# Patient Record
Sex: Female | Born: 1960 | Race: White | Hispanic: No | Marital: Single | Smoking: Former smoker
Health system: Southern US, Community
[De-identification: ages and names within clinical notes are randomized; demographics above are authoritative.]

---

## 2013-10-27 ENCOUNTER — Emergency Department: Payer: Self-pay | Admitting: Emergency Medicine

## 2013-10-27 LAB — URINALYSIS, COMPLETE
Bilirubin,UR: NEGATIVE
Glucose,UR: NEGATIVE mg/dL (ref 0–75)
KETONE: NEGATIVE
LEUKOCYTE ESTERASE: NEGATIVE
NITRITE: NEGATIVE
PH: 6 (ref 4.5–8.0)
Protein: NEGATIVE
SPECIFIC GRAVITY: 1.01 (ref 1.003–1.030)
WBC UR: <1 /HPF (ref 0–5)

## 2013-10-27 LAB — DRUG SCREEN, URINE

## 2013-10-27 LAB — COMPREHENSIVE METABOLIC PANEL
Albumin: 4.3 g/dL (ref 3.4–5.0)
Alkaline Phosphatase: 63 U/L
Anion Gap: 5 — ABNORMAL LOW (ref 7–16)
BILIRUBIN TOTAL: 0.3 mg/dL (ref 0.2–1.0)
BUN: 9 mg/dL (ref 7–18)
CO2: 28 mmol/L (ref 21–32)
Calcium, Total: 9.1 mg/dL (ref 8.5–10.1)
Chloride: 104 mmol/L (ref 98–107)
Creatinine: 0.99 mg/dL (ref 0.60–1.30)
EGFR (African American): >60
GLUCOSE: 98 mg/dL (ref 65–99)
OSMOLALITY: 272 (ref 275–301)
POTASSIUM: 3.4 mmol/L — AB (ref 3.5–5.1)
SGOT(AST): 14 U/L — ABNORMAL LOW (ref 15–37)
SGPT (ALT): 22 U/L (ref 12–78)
SODIUM: 137 mmol/L (ref 136–145)
Total Protein: 7.7 g/dL (ref 6.4–8.2)

## 2013-10-27 LAB — CBC
HCT: 44.3 % (ref 35.0–47.0)
HGB: 14.7 g/dL (ref 12.0–16.0)
MCH: 31.5 pg (ref 26.0–34.0)
MCHC: 33.1 g/dL (ref 32.0–36.0)
MCV: 95 fL (ref 80–100)
Platelet: 243 10*3/uL (ref 150–440)
RBC: 4.66 10*6/uL (ref 3.80–5.20)
RDW: 13.1 % (ref 11.5–14.5)
WBC: 13.1 10*3/uL — ABNORMAL HIGH (ref 3.6–11.0)

## 2013-10-27 LAB — ETHANOL
Ethanol %: <0.003 % (ref 0.000–0.080)
Ethanol: <3 mg/dL

## 2013-10-27 LAB — SALICYLATE LEVEL: Salicylates, Serum: 4.6 mg/dL — ABNORMAL HIGH

## 2013-10-27 LAB — ACETAMINOPHEN LEVEL: Acetaminophen: <2 ug/mL

## 2013-11-03 LAB — COMPREHENSIVE METABOLIC PANEL
ALBUMIN: 3.8 g/dL (ref 3.4–5.0)
ANION GAP: 8 (ref 7–16)
Alkaline Phosphatase: 58 U/L
BUN: 4 mg/dL — AB (ref 7–18)
Bilirubin,Total: 0.3 mg/dL (ref 0.2–1.0)
Calcium, Total: 9 mg/dL (ref 8.5–10.1)
Chloride: 104 mmol/L (ref 98–107)
Co2: 27 mmol/L (ref 21–32)
Creatinine: 0.65 mg/dL (ref 0.60–1.30)
Glucose: 91 mg/dL (ref 65–99)
Osmolality: 274 (ref 275–301)
Potassium: 3.4 mmol/L — ABNORMAL LOW (ref 3.5–5.1)
SGOT(AST): 13 U/L — ABNORMAL LOW (ref 15–37)
SGPT (ALT): 22 U/L (ref 12–78)
SODIUM: 139 mmol/L (ref 136–145)
Total Protein: 7 g/dL (ref 6.4–8.2)

## 2013-11-03 LAB — URINALYSIS, COMPLETE
BACTERIA: NONE SEEN
Bilirubin,UR: NEGATIVE
GLUCOSE, UR: NEGATIVE mg/dL (ref 0–75)
Ketone: NEGATIVE
Leukocyte Esterase: NEGATIVE
NITRITE: NEGATIVE
PH: 6 (ref 4.5–8.0)
Protein: NEGATIVE
RBC,UR: 2 /HPF (ref 0–5)
SPECIFIC GRAVITY: 1.004 (ref 1.003–1.030)
Squamous Epithelial: NONE SEEN

## 2013-11-03 LAB — DRUG SCREEN, URINE

## 2013-11-03 LAB — PREGNANCY, URINE: Pregnancy Test, Urine: NEGATIVE m[IU]/mL

## 2013-11-03 LAB — CBC
HCT: 40.9 % (ref 35.0–47.0)
HGB: 13.9 g/dL (ref 12.0–16.0)
MCH: 32.1 pg (ref 26.0–34.0)
MCHC: 34 g/dL (ref 32.0–36.0)
MCV: 94 fL (ref 80–100)
Platelet: 235 10*3/uL (ref 150–440)
RBC: 4.33 10*6/uL (ref 3.80–5.20)
RDW: 13 % (ref 11.5–14.5)
WBC: 10.8 10*3/uL (ref 3.6–11.0)

## 2013-11-03 LAB — ETHANOL
Ethanol %: <0.003 % (ref 0.000–0.080)
Ethanol: <3 mg/dL

## 2013-11-03 LAB — ACETAMINOPHEN LEVEL: Acetaminophen: <2 ug/mL

## 2013-11-03 LAB — SALICYLATE LEVEL: SALICYLATES, SERUM: 5 mg/dL — AB

## 2013-11-04 ENCOUNTER — Inpatient Hospital Stay: Payer: Self-pay | Admitting: Psychiatry

## 2013-11-19 LAB — BASIC METABOLIC PANEL
Anion Gap: 2 — ABNORMAL LOW (ref 7–16)
BUN: 13 mg/dL (ref 7–18)
Calcium, Total: 9.1 mg/dL (ref 8.5–10.1)
Chloride: 103 mmol/L (ref 98–107)
Co2: 33 mmol/L — ABNORMAL HIGH (ref 21–32)
Creatinine: 0.7 mg/dL (ref 0.60–1.30)
EGFR (African American): >60
EGFR (Non-African Amer.): >60
Glucose: 92 mg/dL (ref 65–99)
Osmolality: 275 (ref 275–301)
Potassium: 3.9 mmol/L (ref 3.5–5.1)
Sodium: 138 mmol/L (ref 136–145)

## 2013-11-19 LAB — MAGNESIUM: Magnesium: 2 mg/dL

## 2013-12-15 ENCOUNTER — Inpatient Hospital Stay: Payer: Self-pay | Admitting: Psychiatry

## 2013-12-15 LAB — URINALYSIS, COMPLETE
Bacteria: NONE SEEN
Bilirubin,UR: NEGATIVE
GLUCOSE, UR: NEGATIVE mg/dL (ref 0–75)
LEUKOCYTE ESTERASE: NEGATIVE
Nitrite: NEGATIVE
PH: 7 (ref 4.5–8.0)
PROTEIN: NEGATIVE
RBC,UR: 6 /HPF (ref 0–5)
Specific Gravity: 1.003 (ref 1.003–1.030)
Squamous Epithelial: NONE SEEN
WBC UR: NONE SEEN /HPF (ref 0–5)

## 2013-12-15 LAB — DRUG SCREEN, URINE
Amphetamines, Ur Screen: NEGATIVE (ref ?–1000)
BENZODIAZEPINE, UR SCRN: NEGATIVE (ref ?–200)
Barbiturates, Ur Screen: NEGATIVE (ref ?–200)
Cannabinoid 50 Ng, Ur ~~LOC~~: NEGATIVE (ref ?–50)
Cocaine Metabolite,Ur ~~LOC~~: NEGATIVE (ref ?–300)
MDMA (ECSTASY) UR SCREEN: NEGATIVE (ref ?–500)
Methadone, Ur Screen: NEGATIVE (ref ?–300)
Opiate, Ur Screen: NEGATIVE (ref ?–300)
Phencyclidine (PCP) Ur S: NEGATIVE (ref ?–25)
Tricyclic, Ur Screen: NEGATIVE (ref ?–1000)

## 2013-12-15 LAB — CBC
HCT: 38.6 % (ref 35.0–47.0)
HGB: 12.8 g/dL (ref 12.0–16.0)
MCH: 31.3 pg (ref 26.0–34.0)
MCHC: 33.2 g/dL (ref 32.0–36.0)
MCV: 94 fL (ref 80–100)
Platelet: 246 10*3/uL (ref 150–440)
RBC: 4.09 10*6/uL (ref 3.80–5.20)
RDW: 13.3 % (ref 11.5–14.5)
WBC: 11.1 10*3/uL — ABNORMAL HIGH (ref 3.6–11.0)

## 2013-12-15 LAB — COMPREHENSIVE METABOLIC PANEL WITH GFR
Albumin: 3.7 g/dL
Alkaline Phosphatase: 58 U/L
Anion Gap: 5 — ABNORMAL LOW
BUN: 4 mg/dL — ABNORMAL LOW
Bilirubin,Total: 0.3 mg/dL
Calcium, Total: 8.7 mg/dL
Chloride: 100 mmol/L
Co2: 26 mmol/L
Creatinine: 0.61 mg/dL
EGFR (African American): >60
EGFR (Non-African Amer.): >60
Glucose: 102 mg/dL — ABNORMAL HIGH
Osmolality: 260
Potassium: 3.5 mmol/L
SGOT(AST): 18 U/L
SGPT (ALT): 37 U/L
Sodium: 131 mmol/L — ABNORMAL LOW
Total Protein: 6.5 g/dL

## 2013-12-15 LAB — ETHANOL: Ethanol: <3 mg/dL

## 2013-12-15 LAB — ACETAMINOPHEN LEVEL: Acetaminophen: <2 ug/mL

## 2013-12-15 LAB — SALICYLATE LEVEL: Salicylates, Serum: 5.4 mg/dL — ABNORMAL HIGH

## 2014-04-18 ENCOUNTER — Inpatient Hospital Stay: Payer: Self-pay | Admitting: Psychiatry

## 2014-04-18 LAB — URINALYSIS, COMPLETE
Bacteria: NONE SEEN
Bilirubin,UR: NEGATIVE
Glucose,UR: NEGATIVE mg/dL (ref 0–75)
Ketone: NEGATIVE
Leukocyte Esterase: NEGATIVE
Nitrite: NEGATIVE
Ph: 7 (ref 4.5–8.0)
Protein: NEGATIVE
RBC,UR: 1 /HPF (ref 0–5)
Specific Gravity: 1.005 (ref 1.003–1.030)
WBC UR: NONE SEEN /HPF (ref 0–5)

## 2014-04-18 LAB — COMPREHENSIVE METABOLIC PANEL
Albumin: 3.3 g/dL — ABNORMAL LOW (ref 3.4–5.0)
Alkaline Phosphatase: 43 U/L — ABNORMAL LOW
Anion Gap: 5 — ABNORMAL LOW (ref 7–16)
BILIRUBIN TOTAL: 0.1 mg/dL — AB (ref 0.2–1.0)
BUN: 7 mg/dL (ref 7–18)
CALCIUM: 7.8 mg/dL — AB (ref 8.5–10.1)
CREATININE: 0.68 mg/dL (ref 0.60–1.30)
Chloride: 105 mmol/L (ref 98–107)
Co2: 27 mmol/L (ref 21–32)
EGFR (African American): >60
EGFR (Non-African Amer.): >60
Glucose: 116 mg/dL — ABNORMAL HIGH (ref 65–99)
Osmolality: 273 (ref 275–301)
Potassium: 3.2 mmol/L — ABNORMAL LOW (ref 3.5–5.1)
SGOT(AST): 13 U/L — ABNORMAL LOW (ref 15–37)
SGPT (ALT): 15 U/L
Sodium: 137 mmol/L (ref 136–145)
TOTAL PROTEIN: 5.9 g/dL — AB (ref 6.4–8.2)

## 2014-04-18 LAB — DRUG SCREEN, URINE
AMPHETAMINES, UR SCREEN: NEGATIVE (ref ?–1000)
Barbiturates, Ur Screen: NEGATIVE (ref ?–200)
Benzodiazepine, Ur Scrn: NEGATIVE (ref ?–200)
Cannabinoid 50 Ng, Ur ~~LOC~~: NEGATIVE (ref ?–50)
Cocaine Metabolite,Ur ~~LOC~~: NEGATIVE (ref ?–300)
MDMA (ECSTASY) UR SCREEN: NEGATIVE (ref ?–500)
METHADONE, UR SCREEN: NEGATIVE (ref ?–300)
OPIATE, UR SCREEN: NEGATIVE (ref ?–300)
PHENCYCLIDINE (PCP) UR S: NEGATIVE (ref ?–25)
Tricyclic, Ur Screen: NEGATIVE (ref ?–1000)

## 2014-04-18 LAB — CBC
HCT: 39.3 % (ref 35.0–47.0)
HGB: 12.8 g/dL (ref 12.0–16.0)
MCH: 31.2 pg (ref 26.0–34.0)
MCHC: 32.7 g/dL (ref 32.0–36.0)
MCV: 96 fL (ref 80–100)
PLATELETS: 200 10*3/uL (ref 150–440)
RBC: 4.12 10*6/uL (ref 3.80–5.20)
RDW: 13.6 % (ref 11.5–14.5)
WBC: 10.2 10*3/uL (ref 3.6–11.0)

## 2014-04-18 LAB — ETHANOL: Ethanol %: <0.003 % (ref 0.000–0.080)

## 2014-04-18 LAB — ACETAMINOPHEN LEVEL

## 2014-04-18 LAB — SALICYLATE LEVEL: Salicylates, Serum: 3.4 mg/dL — ABNORMAL HIGH

## 2014-04-25 LAB — BEHAVIORAL MEDICINE 1 PANEL
ALBUMIN: 3.9 g/dL (ref 3.4–5.0)
ALK PHOS: 49 U/L
ALT: 20 U/L
Anion Gap: 7 (ref 7–16)
BASOS ABS: 0.1 10*3/uL (ref 0.0–0.1)
BASOS PCT: 0.8 %
BILIRUBIN TOTAL: 0.4 mg/dL (ref 0.2–1.0)
BUN: 7 mg/dL (ref 7–18)
CALCIUM: 9.1 mg/dL (ref 8.5–10.1)
CHLORIDE: 101 mmol/L (ref 98–107)
CO2: 31 mmol/L (ref 21–32)
Creatinine: 0.77 mg/dL (ref 0.60–1.30)
EGFR (Non-African Amer.): >60
EOS ABS: 0.2 10*3/uL (ref 0.0–0.7)
Eosinophil %: 2.4 %
Glucose: 90 mg/dL (ref 65–99)
HCT: 42.5 % (ref 35.0–47.0)
HGB: 14.1 g/dL (ref 12.0–16.0)
LYMPHS ABS: 3.2 10*3/uL (ref 1.0–3.6)
LYMPHS PCT: 42.4 %
MCH: 31.4 pg (ref 26.0–34.0)
MCHC: 33.1 g/dL (ref 32.0–36.0)
MCV: 95 fL (ref 80–100)
MONOS PCT: 8.3 %
Monocyte #: 0.6 x10 3/mm (ref 0.2–0.9)
NEUTROS ABS: 3.5 10*3/uL (ref 1.4–6.5)
Neutrophil %: 46.1 %
Osmolality: 275 (ref 275–301)
POTASSIUM: 4.3 mmol/L (ref 3.5–5.1)
Platelet: 235 10*3/uL (ref 150–440)
RBC: 4.49 10*6/uL (ref 3.80–5.20)
RDW: 13.8 % (ref 11.5–14.5)
SGOT(AST): 15 U/L (ref 15–37)
Sodium: 139 mmol/L (ref 136–145)
Thyroid Stimulating Horm: 2.76 u[IU]/mL
Total Protein: 6.8 g/dL (ref 6.4–8.2)
WBC: 7.6 10*3/uL (ref 3.6–11.0)

## 2014-04-26 LAB — URINALYSIS, COMPLETE
Bacteria: NONE SEEN
Bilirubin,UR: NEGATIVE
Blood: NEGATIVE
GLUCOSE, UR: NEGATIVE mg/dL (ref 0–75)
KETONE: NEGATIVE
Leukocyte Esterase: NEGATIVE
Nitrite: NEGATIVE
Ph: 7 (ref 4.5–8.0)
Protein: NEGATIVE
RBC,UR: 1 /HPF (ref 0–5)
Specific Gravity: 1.008 (ref 1.003–1.030)
Squamous Epithelial: NONE SEEN
WBC UR: NONE SEEN /HPF (ref 0–5)

## 2014-12-29 NOTE — Consult Note (Signed)
PATIENT NAME:  Carol Choi, Carol Choi MR#:  [id number] DATE OF BIRTH:  Jan 08, 1961  DATE OF CONSULTATION:  04/27/2014  REFERRING PHYSICIAN:    CONSULTING PHYSICIAN:  Javonni Macke A. Allena KatzPatel, MD  REFERRING PHYSICIAN:  Audery AmelJohn T Clapacs, MD   CONSULTING PHYSICIAN:   Willow OraSona A Kiefer Opheim, MD    REASON FOR CONSULTATION: Fever and abnormal x-ray.   HISTORY OF PRESENT ILLNESS:  Ms. Carol PotterFrances Choi is a 54 year old Caucasian female with past medical history of major depression who was admitted on 04/18/2014. She has been receiving ECT treatments, and she had her first ECT treatment.  Patient was noted to have some URI congestion, and some cough along with fever of 99.1. She got a chest x-ray, which shows some mild congestion. The patient denies any chest tightness, or any shortness of breath. She does have a history of 2 pack smoking tobacco for many years. Internal medicine was consulted for low-grade fever, and possible URI.   PAST MEDICAL HISTORY:  Major depression.   FAMILY HISTORY: Patient is not able to tell me much about her family.   MEDICATIONS:  Prozac 20 mg p.o. daily.   SOCIAL HISTORY: She smokes about 2 packs of cigarettes a day; does not drink; she is trying to get disability.   REVIEW OF SYSTEMS:  CONSTITUTIONAL: No fever, chills, positive for fatigue, and weakness.  EYES: No blurred or double vision, cataracts, or glaucoma.  ENT: No hearing loss, tinnitus, or ear discharge.  RESPIRATORY: Positive for mild shortness of breath and some cough, and congestion, no chest pain.  CARDIOVASCULAR: No chest pain, orthopnea, or edema; positive for hypertension.  GASTROINTESTINAL: No abdominal pain, nausea, vomiting, or diarrhea.  GENITOURINARY: No dysuria, no incontinence, frequency, or dysuria.  ENDOCRINE: No heat, cold intolerance, no thyroid problems.  SKIN: No acne or rash.  MUSCULOSKELETAL: Positive for some shoulder pain bilaterally which appears chronic, no arthritis.  NEUROLOGIC: No CVA, TIA,  dysarthria, or ataxia.  PSYCHIATRIC: Positive for major depression. All other systems reviewed and negative.   LABORATORY: UA negative for urinary tract infection, white count is 7.6, H and H is 14.1 and 42.5; comprehensive metabolic panel is within normal limits. Chest x-ray is hyperinflation, but mild nodular density in the left lung base.   ASSESSMENT: A 54 year old Ms. Carol Choi with history of major depression, and ongoing tobacco abuse. Internal medicine was consulted for a low-grade fever, cough, and congestion.  1.  Acute upper respiratory infection/congestion.  The patient appears hemodynamically stable. She did have a low-grade temperature of 99.1, early part of the morning; denies any history of dysphagia, or any difficulty swallowing. She had her ECT treatment. No respiratory distress noted per Dr. Toni Amendlapacs  during ECT treatment. The patient's saturations are 96% on room air. We will give her Z-Pak for possible upper respiratory infection/upper respiratory infection, congestion. Her white count is stable which was done a couple of days ago. Continue to monitor her fever curve.  2.  Major depression. Per psychiatry.  3.  Tobacco abuse. Patient was advised on smoking cessation counseling was done for about four minutes, not sure if the patient is ready at this time for smoking cessation.   Thank you for the consult.   We will sign off for now, if needed please piece call with any questions.    ____________________________ Wylie HailSona A. Allena KatzPatel, MD sap:nt D: 04/27/2014 19:28:27 ET T: 04/27/2014 20:44:53 ET JOB#: [id number]  cc: Yafet Cline A. Allena KatzPatel, MD, <Dictator> Willow OraSONA A Lenus Trauger MD ELECTRONICALLY SIGNED 05/03/2014 12:32

## 2014-12-29 NOTE — H&P (Signed)
PATIENT NAME:  Carol Choi, Carol Choi MR#:  [id number] DATE OF BIRTH:  1961/02/18  DATE OF ADMISSION:  12/15/2013  INITIAL PSYCHIATRIC EVALUATION  IDENTIFYING INFORMATION: The patient is a 54 year old white female, not employed, single, never married, and currently lives with her sister and mother and nieces and nephews and a total of 9 people live in a double-wide trailer. The patient was discharged on 12/01/2013 after being stabilized by Dr. Toni Amend in inpatient psychiatry setting. She was discharged for followup appointment at [facility] for community support.  DISCHARGE MEDICATIONS: 1. Trazodone 150 mg p.o. at bedtime. 2. Effexor ER 150 mg once a day. 3. Haldol 5 mg 3 times a day. 4. Docusate 200 mg twice a day.  The patient reports that she has been compliant with the medications. The patient comes back for readmission to psychiatry and stated "Look at me, I cut myself," and showed her left forearm. The patient reports that she cut her left forearm with a kitchen knife and when she was asked the reason she did it, she states, "I had to do it."    PAST PSYCHIATRIC HISTORY: This is the third inpatient or hospital psychiatry. The first inpatient hospital psychiatry was many many years ago. Second inpatient hospital or psychiatry was in February of 2015 with a similar episode of depression and attempting to cut herself. The patient was stabilized and she was going to follow up at [facility] but did not have time to go for the appointment because she came back before her appointment date.  FAMILY HISTORY OF MENTAL ILLNESS: None known for mental illness, no known history of suicide in the family.  FAMILY HISTORY: Raised by her parents. Father worked in Production designer, theatre/television/film. Father died of heart attack at age 80. Mother is 70 years old. Has 1 sister. She is close to family and in fact lives with them.  PERSONAL HISTORY: Born in [place]. Got her GED. No college. 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  Quit because she had to deal with her father's death and help her mother around.  MEDICAL HISTORY: None applicable. Alcohol or drugs: Has an occasional drink of alcohol. No history of DWI, never arrested for public drunkenness. Denies street or prescription abuse. Denies using IV drugs. Smokes nicotine cigarettes at the rate of 2 packs a day for many years. No known history of high blood pressure, no diabetes mellitus.  PAST SURGICAL HISTORY: No major surgeries. No major injuries. No history of HBP or D.Mellitus No H/O MVA and, never been unconscious.  ALLERGIES: ALLERGIC TO CODEINE.   PRIMARY CARE PHYSICIAN: Does not have any doctor that follows her and goes to emergency room as needed.  PHYSICAL EXAMINATION: VITALS SIGNS: Temperature is 98.4, pulse is 71 and regular, respirations 20 per minute, regular. Blood pressure is 108/70 mmHg.   HEENT: Head is normocephalic, atraumatic. Eyes: PERRLA, fundi bilaterally benign. EOMs full. Tympanic membrane appear regular with no exudates.  NECK: Supple without any organomegaly, lymphadenopathy, thyromegaly. CHEST: Normal expansion, normal breath sounds heard.  HEART: Normal S1 without any murmurs or rubs. ABDOMEN: Soft, no organomegaly. Bowel sounds heard. RECTAL: Deferred. PELVIC: Deferred.  NEUROLOGIC: Gait is normal. Romberg is negative. Cranial nerves II-XII are grossly intact and normal. DTRs 1+ and normal. SKIN: Normal turgor except she has several sutures put on left forearm where it is superficial laceration secondary to patient try to cut herself.   MENTAL STATUS EXAMINATION: The patient is dressed in street clothes. Alert and oriented to  place, person, and time. Fully aware of the situation that brought here for admission to [facility]. Affect is flat. Mood restricted. Feels low and down and depressed for no special reason, very slow psychomotor activity. Speech is slow and low in tone.. Thought processes are slow but lucid. No psychosis and has no  auditory or visual hallucinations, delusional or paranoid thinking. Does have suicidal wishes and thoughts but contracts for safety and said "Not at this moment." Short term and long term memory grossly intact. She could count money. She knew the [place] and the capital of the [place], and the name of the current president. General knowledge of information is fair for level of education. Alert and oriented to place person and time. Insight and judgement is guarded with poor impulse control.  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 Nicotine dependence, chronic, continues.  AXIS II: Deferred, none major.  AXIS III: None major except superficial laceration of the left forearms, self inflicted.   AXIS IV: Severe. Occupational, financial, and discord with family with whom she lives.  AXIS V: GAF at time of admission 25.  PLAN: [facility] for close observation and evaluation. She will be started back on all of her medications. During the stay in the hospital, she began milieu therapy and supportive counseling where coping skills in dealing with stressors of life and interpersonal interactions will be addressed. At the time of discharge, the patient will be stabilized and proper followup appointments will be made in the community.    ____________________________ Jannet MantisSurya K. Guss Bundehalla, MD skc:lt D: 12/16/2013 21:45:00 ET T: 12/16/2013 23:21:53 ET JOB#: [id number]  cc: Monika SalkSurya K. Guss Bundehalla, MD, <Dictator> Beau FannySURYA K Mackensey Bolte MD ELECTRONICALLY SIGNED 12/17/2013 19:26

## 2014-12-29 NOTE — Discharge Summary (Signed)
PATIENT NAME:  Carol Choi, TRICE MR#:  [id number] DATE OF BIRTH:  1961/04/25  DATE OF ADMISSION:  12/15/2013 DATE OF DISCHARGE:  12/29/2013  HOSPITAL COURSE: See dictated history and physical for details of admission. This 54 year old woman with a history of previously treated depression presented back into the hospital with severely depressed mood, paranoid ideation, dangerous behavior. She had cut herself on the arm requiring stitches. The patient was very withdrawn and depressed. Talking about killing herself, talked about how she knew she was going to die. For many days she remained quite withdrawn. She endorsed a paranoid idea that there was a woman she somehow knew from long ago who was controlling her life and was trying to kill her and her family. The patient appeared to have psychotic depression. She was advised that I strongly recommended ECT treatment. She was educated multiple times about ECT. I even had the ECT nurse come and meet with her and show her the video. The patient adamantly refused to have ECT treatment, however. We continued treatment for many days with antidepressant and antipsychotic. Eventually it became clear that this medicine did not seem to be making much progress. She was switched to Remeron and Haldol. Since then, she has shown slow but steady improvement. For many days now her affect has been brighter and more upbeat. Her energy level is better. She participates appropriately in groups. She is no longer endorsing any suicidal thoughts and also says she is no longer focused on her thinking about her paranoid ideas. I asked her to think carefully about the stresses that she would have at home and she said that she feels safe going back there at this time. She is going to be followed up by the community support team and has met with the representative and agrees to the treatment plan. She was specifically educated today that previous medicine that she had had from her last visit  needed to be discarded down the toilet and that she should only continue on the medicine that she is being discharged on today. I have tried to make sure she will get a 2-week supply of her medicine plus her prescriptions.   MENTAL STATUS EXAMINATION AT DISCHARGE: The patient appears in no acute distress. Cooperative with the interview. Eye contact good. Psychomotor activity more normal and appropriate. Speech is normal tone, still decreased in amount. Affect is more relaxed and euthymic, with appropriate reactivity. Mood is stated as better. Thoughts appear to be lucid although still slow. No obvious delusional thinking or loosening association. Denies auditory or visual hallucinations. Denies paranoia. Denies suicidal or homicidal ideation. Shows improved insight and judgment. Short and long-term memory intact. Average fund of knowledge.   LABORATORY RESULTS: On admission drug screen negative. Chemistry panel: No significant abnormalities. Alcohol level negative. CBC, slightly elevated white count at 11.1, otherwise unremarkable. Urinalysis positive only for blood, no sign of infection. Salicylates slightly elevated at 5.4. Acetaminophen negative.   DISCHARGE MEDICATIONS: Docusate 200 mg twice a day, trazodone 150 mg at night, mirtazapine 45 mg at night, Haldol 5 mg twice a day.   DISPOSITION: She is being discharged home. Community support team from [place] will be working with her. The patient had a court date scheduled for this upcoming Tuesday. She was very anxious about that. When it was unclear when she would be discharge, we started to make preparations to have that continued. I believe social work has carried through on that. Patient will have to make sure she follows up  with that as newly scheduled.   DIAGNOSIS, PRINCIPAL AND PRIMARY:   AXIS I: Major depression, severe, recurrent, with psychotic features.   SECONDARY DIAGNOSES:  AXIS I: No further.   AXIS II: No diagnosis.   AXIS III: No  diagnosis.   AXIS IV: Severe financial hardship and social stress at home.   AXIS V: Functioning at time of discharge 54.    ____________________________ Gonzella Lex, MD jtc:lt D: 12/29/2013 17:49:15 ET T: 12/29/2013 23:20:12 ET JOB#: [id number]  cc: Gonzella Lex, MD, <Dictator> Gonzella Lex MD ELECTRONICALLY SIGNED 01/01/2014 15:22

## 2014-12-29 NOTE — Discharge Summary (Signed)
PATIENT NAME:  Carol Choi, Carol Choi MR#:  [id number] DATE OF BIRTH:  03-Feb-1961  DATE OF ADMISSION:  11/04/2013 DATE OF DISCHARGE:  12/01/2013   HOSPITAL COURSE: See dictated history and physical for details of admission. This 54 year old woman with a history of probably depression in the past came into the hospital with depressed mood, attempts to cut herself, some suicidal ideation. Affect was very flat. Also had some thoughts that appeared to be paranoid, possibly delusional. In the hospital, she was treated with medication and individual and group psychotherapy. She has been on an antidepressant, Effexor, and Navane as an antipsychotic. She has been compliant with medicine. She has not engaged in any dangerous or violent behavior. She has been cooperative with group attendance and individual and daily psychotherapy. The patient has shown slow improvement, continuing to have a flat affect and mood throughout her hospital stay. She claims to believe that someone at home is likely to kill her or her family. When discussing it in detail, it sounds like it os possibly psychotic but might also be just an exaggerated fear. She does not appear to have other specific psychotic symptoms. The patient has frequently voiced some vague suicidal thoughts, but gradually has shown an improvement in that. She was very focused on not wanting to be discharged home, but it has become increasingly clear that there is no place else for her to stay except the homeless shelter. She would rather go back home where her dogs are. The patient is going to be referred for followup in the community and will go to [facility] and have community support services. She is encouraged to follow up on applying for disability, to try and get some financial and medical insurance support as well. She is agreeable to all of this. At the time of discharge does not appear acutely dangerous. Agrees to the overall treatment plan.   DISCHARGE MEDICATIONS:  Trazodone 150 mg p.o. at bedtime, venlafaxine extended-release 150 mg once a day, thiothixene 5 mg 3 times a day, docusate 200 mg twice a day.   LABORATORY RESULTS: Admission labs show chemistry panel with low potassium at 3.4, low BUN at 4. Alcohol level negative. Drug screen negative. CBC was all normal. Urinalysis: 1+ blood, no clear sign of infection. Followup chemistry panel normalized. Magnesium normal.   MENTAL STATUS EXAM AT DISCHARGE: Casually dressed, reasonably well-groomed woman, looks her stated age, cooperative with the interview. Good eye contact. Somewhat slow psychomotor activity. Speech slow and decreased in total amount. Affect a little bit blunted but not as much as when she was first admitted. Mood stated as being a little bit better. Thoughts are slow but lucid. No obvious loosening of associations. She has a paranoid thought about somebody at home who might be threatening her, but it is not clear whether it is truly psychotic. Denies auditory or visual hallucinations. Currently denies any suicidal intent or plan. No homicidal ideation. Judgment and insight improved. Short and long-term memory grossly intact. Fund of knowledge normal. Alert and oriented x 4.   DISPOSITION: Discharge home. Follow up with [facility] community support.   DIAGNOSIS, PRINCIPAL AND PRIMARY:  AXIS I: Major depression, single, severe, with psychotic features.   SECONDARY DIAGNOSES: AXIS I: No further.  AXIS II: Deferred.  AXIS III: No diagnosis.  AXIS IV: Severe from financial problems and discord in the family.  AXIS V: Functioning at time of discharge 54.   ____________________________ Audery AmelJohn T. Terrel Nesheiwat, MD jtc:jcm D: 12/01/2013 16:23:47 ET T: 12/01/2013 22:13:00 ET  JOB#: [id number]  cc: Audery Amel, MD, <Dictator> Audery Amel MD ELECTRONICALLY SIGNED 12/04/2013 9:54

## 2014-12-29 NOTE — Consult Note (Signed)
Brief Consult Note: Diagnosis: major depression.   Patient was seen by consultant.   Consult note dictated.   Recommend further assessment or treatment.   Orders entered.   Discussed with Attending MD.   Comments: Psychiatry: Patient seen. chart reviewed. History of severe major depression. Took overdose of prozac. Currently severely depressed with near catatonic preentation. [facility]. Full note dictated.  Electronic Signatures: Audery Amellapacs, Kemper Heupel T (MD)  (Signed 12-Aug-15 14:08)  Authored: Brief Consult Note   Last Updated: 12-Aug-15 14:08 by Audery Amellapacs, Weslee Prestage T (MD)

## 2014-12-29 NOTE — Consult Note (Signed)
PATIENT NAME:  Carol Choi, HUEBERT MR#:  [id number] DATE OF BIRTH:  [birth year]  DATE OF CONSULTATION:  04/18/2014  CONSULTING PHYSICIAN:  Audery Amel, MD  IDENTIFYING INFORMATION AND REASON FOR CONSULTATION: A 54 year old woman with a history of major depression, presented after taking an overdose of fluoxetine.  CHIEF COMPLAINT: "I took pills."   HISTORY OF PRESENT ILLNESS: Information obtained from the patient and the chart. The patient states that she took an overdose of Prozac. Cannot remember how much it was, but says that she did not take any other pills with it. When asked why she did it, she said that she "had to." She is unable to elaborate anymore on what that means. She is a poor historian, being nearly catatonic at this point. Indicates that she has been feeling bad for quite a while. Not eating well. Not doing much. Does not note any particular new stress. She says that she has been taking medication that was prescribed to her by Dr. Georjean Mode at [facility].  She is not abusing alcohol or drugs. No known new medical problems.   PAST PSYCHIATRIC HISTORY: This patient had a lengthy hospitalization earlier this year for severe major depression with psychotic features, at one point of which she became similar to what she is right now. I had tried to talk her into ECT, which she adamantly refused. After an extended time of treatment with mirtazapine and Abilify, she eventually recovered and was well enough to go home. She has been getting followup at [facility] since then. Has a history of depression prior to that as well.   SUBSTANCE ABUSE HISTORY: Currently not drinking and not using drugs. I am not sure whether there has been a substance abuse problem in the distant past.   SOCIAL HISTORY: [place] The last time we evaluated the situation, it seemed to be very suboptimal for her. She did not get along with people there, and it felt like it made it difficult  for her to get treatment. It sounds like community support has been working with her to keep her in treatment recently.   PAST MEDICAL HISTORY: Other than being chronically underweight, has no significant medical problems.   FAMILY HISTORY: Positive for depression.   REVIEW OF SYSTEMS:  Unable to offer much history right now. Says that her upper arms hurt on both sides. She cannot be any more specific than that. Otherwise, not making any specific complaints.   MENTAL STATUS EXAMINATION:  Small, chronically ill-looking woman interviewed in the Emergency Room. She was very passively cooperative. Made eye contact briefly but did not sustain it. Psychomotor activity was almost nonexistent. Speech very quiet and only a few words at a time. Affect is flat. Mood is not really stated. Thoughts show a great deal of thought blocking. It sounds like there is a chance she may be responding to internal stimuli when she talks about how she had to take an overdose, but she denies that she is hearing voices. Admits that she has visual hallucinations. Does not specify immediately about ongoing suicidal or homicidal ideation. She is alert and oriented, and she can repeat 3 words immediately, although very slowly, but cannot remember any of them at about 2-3 minutes. Baseline intellectual functioning probably low average from what we saw last time.   PHYSICAL EXAMINATION:  VITAL SIGNS: The patient is small, weighing only 96 pounds, but, even at her best, she is a low weight  and does not look significantly worse than she did before.  Current blood pressure most recently 96/63, respirations 15, pulse 61, temperature 98.4. SKIN:  No acute lesions identified.  LABORATORY DATA:  Salicylates only slightly elevated, not toxic. Acetaminophen negative. Chemistry panel, however, shows a low calcium, 7.8, low bilirubin, low alkaline phosphatase of 43, AST low at 13. Total protein low 5.9, albumin low 3.3. Drug screen is all  negative; CBC unremarkable.  Urinalysis noninfected.   CURRENT MEDICATIONS: Hard to tell from her. Evidently, she has been prescribed some Prozac since she had it at home. When she left here last time, she was on a combination of Remeron, Haldol, and trazodone because we were looking for medicine she could afford.   ALLERGIES: CODEINE.   ASSESSMENT: A 54 year old woman with severe major depression with psychotic features, recurrent, nearly catatonic. Made a serious suicide attempt and requires hospitalization.   TREATMENT PLAN: Admit to psychiatry. Multiple precautions in place. Ordered extra food on her diet. We can redraw labs tomorrow to see whether eating and rehydration helps with some of it. I will leave decisions about medications to the primary team downstairs.   DIAGNOSIS, PRINCIPAL AND PRIMARY: AXIS I: Major depression, severe, recurrent, with psychotic features.   SECONDARY DIAGNOSES:  AXIS I: No further.  AXIS II: No diagnosis.  AXIS III:  Underweight, possibly malnourished.  AXIS IV: Chronic, severe.  AXIS V: Functioning at time of evaluation 24.    ____________________________ Audery AmelJohn T. Clapacs, MD jtc:DT D: 04/18/2014 14:20:07 ET T: 04/18/2014 14:29:56 ET JOB#: [id number]  cc: Audery AmelJohn T. Clapacs, MD, <Dictator> Audery AmelJOHN T CLAPACS MD ELECTRONICALLY SIGNED 05/18/2014 17:03

## 2014-12-29 NOTE — Discharge Summary (Signed)
PATIENT NAME:  Carol Choi, Carol Choi MR#:  [id number] DATE OF BIRTH:  [birth year]  DATE OF ADMISSION:  04/18/2014 DATE OF DISCHARGE:  05/17/2014  HOSPITAL COURSE: See dictated history and physical. A 54 year old woman with recurrent depression admitted to the hospital nearly catatonic, treated with medication, partial response. ECT consultation obtained. Transfer to Dr. Toni Amendlapacs' service. ECT was implemented. The patient had several ECT treatments, showed partial response. Became more alert and active and energetic. Continued to feel depressed when she thought about going home to her unsupportive family. Major stresses of lack of finances, lack of support, and unpleasant home environment remained. The patient did not engage in any suicidal behavior and she was compliant with medication. The patient was discharged with a plan that she would [facility]. No resources are in place to allow for outpatient ECT. The patient's plan was to continue current medication and work with the community support team. She agreed to the plan.   MENTAL STATUS EXAMINATION AT DISCHARGE: Adequately groomed woman, looks her stated age, cooperative. Eye contact good. Psychomotor activity still slow. Speech decreased in amount, still slow. Affect blunted. Mood stated as alright. Thoughts lucid without loosening of associations. Denies auditory or visual hallucinations. Denies suicidal or homicidal ideation. Remembers 3/3 objects immediately and at three minutes. Long-term memory intact. Judgment and insight intact.   DISCHARGE MEDICATIONS: Prozac 20 mg per day, quetiapine 25 mg at night, mirtazapine 45 mg at night, albuterol inhaler 2 puffs 4 times a day as needed for shortness of breath, docusate 100 mg twice a day, MiraLax 17 grams a day.   LABORATORY RESULTS: See intake. Some lung density seen with hyperinflation on the chest x-ray. Urinalysis unremarkable. Chemistry panel unremarkable. CBC normal. EKG normal.   DISPOSITION:  Discharge home. Follow up with [facility] community support team.   DIAGNOSIS, PRINCIPAL AND PRIMARY:  AXIS I: Major depression, severe with psychotic features.   SECONDARY DIAGNOSES: AXIS I: No further.  AXIS II: Deferred.  AXIS III: No diagnosis.    ____________________________ Audery AmelJohn T. Clapacs, MD jtc:at D: 06/12/2014 10:14:00 ET T: 06/12/2014 10:49:04 ET JOB#: [id number]  cc: Audery AmelJohn T. Clapacs, MD, <Dictator> Audery AmelJOHN T CLAPACS MD ELECTRONICALLY SIGNED 06/14/2014 10:32

## 2014-12-29 NOTE — Consult Note (Signed)
ECT: Consult for this patient with a history of severe recurrent depression with psychotic features.  She returned to the hospital this time nearly catatonic.  Severely depressed mood.  Barely communicating.  Not eating not taking care of herself.  Consultation received 4 ECT treatment. saw the patient on admission and them aware of the history.  She has had adequate outpatient treatment and has still had a severe relapse of psychotic depression.  ECT had been recommended last hospitalization but she would not give consent.  This time she has apparently been convinced to consent to treatment. interview today the patient continues to report feeling bad.  She acknowledges however that she is now agreeable to ECT.  Patient was educated about the practical nature of the treatment.  She was educated about side effects of the treatment.  She gave an indication of verbal consent. has severe depression with psychotic features and catatonic features which is a definite indication for ECT.  She has no major medical problems that would create a clear contraindication for ECT. status exam: Patient is somewhat better groomed than on admission.  Still very slow in her psychomotor activity and flat in her affect.  Minimal speech.  Blank face.  Thought slow.  Mood depressed.  Denies acute suicidal ideation. have been placed to begin right unilateral ECT tomorrow morning.  Patient needs to sign consent paperwork.  The ECT nursing staff is aware.  We will plan right unilateral treatment tomorrow morning and continue with 3 times a week schedule from then on with standard monitoring.  I will transfer her to my care tomorrow morning. Major depression severe with psychotic features  Electronic Signatures: Clapacs, Jackquline DenmarkJohn T (MD)  (Signed on 20-Aug-15 17:02)  Authored  Last Updated: 20-Aug-15 17:02 by Audery Amellapacs, John T (MD)

## 2014-12-29 NOTE — Consult Note (Signed)
PATIENT NAME:  Carol Choi, Carol Choi MR#:  [id number] DATE OF BIRTH:  1961/02/23  DATE OF CONSULTATION:  12/17/2013  REFERRING PHYSICIAN:   CONSULTING PHYSICIAN:  Tynetta Bachmann K. Ahava Kissoon, MD  SUBJECTIVE:  Patient was seen in the [facility].  Patient reports not feeling well.  Still feels low and down, still feels depressed about life in general and her living situation.    OBJECTIVE:  Adequately dressed  but grooming is improved and took a shower.  ADLs improved.  Alert and oriented, calm, pleasant, and cooperative.  Affect is flat.  Mood is constricted and depressed.  Appears low and down without much expression on the face.  Does not appear to be responding to internal stimuli.  Appears hopeless and helpless about her situation and is working that.  Feels worthless and useless at times.  Still has suicidal wishes but contracts for safety. Insight and judgement guarded.  IMPRESSION: Major depressive disorder, recurrent suicidal ideas.    PLAN: Continue current medication, supportive therapy with coping skills discussed.   ____________________________ Jannet MantisSurya K. Guss Bundehalla, MD skc:dd D: 12/17/2013 19:15:29 ET T: 12/17/2013 20:08:15 ET JOB#: [id number]  cc: Monika SalkSurya K. Guss Bundehalla, MD, <Dictator> Beau FannySURYA K Nesanel Aguila MD ELECTRONICALLY SIGNED 12/22/2013 8:20

## 2014-12-29 NOTE — H&P (Signed)
PATIENT NAME:  Carol Choi, Carol Choi MR#:  [id number] DATE OF BIRTH:  1961/01/18  DATE OF ADMISSION:  04/18/2014  DATE OF ASSESSMENT: 04/19/2014  REFERRING PHYSICIAN: EMERGENCY ROOM MD  ATTENDING PHYSICIAN: Kynslei Art B. Jennet MaduroPucilowska, MD  IDENTIFYING DATA: Ms. Carol Choi is a 54 year old female with long history of depression.   CHIEF COMPLAINT: The patient unable to state.  HISTORY OF PRESENT ILLNESS: Ms. Carol Choi was hospitalized at [facility] twice this year, in February and in April. Both were extended hospitalizations for severe depression with psychotic features. The patient eventually recovered and was discharged to home both times, but has never been able to maintain improvement at home. Her living conditions and her financial and economic status is very poor. She lives with her sister in a trailer with multiple family members. She has no income. She has no transportation. No support. Following last discharge, the patient was referred to [facility], where she has been seeing Dr. Georjean ModeLitz and works with community support team. [facility]. In spite of good medication compliance, the patient became increasingly depressed and suicidal with poor sleep, decreased appetite, weight loss, anhedonia, feeling of guilt, hopelessness, worthlessness, poor energy and concentration, social isolation, crying spells. A week ago, she received denial letter for her disability application and Medicaid. This probably contributed to her depression. She overdosed on Prozac. She has no explanation why she did it, except to say repeatedly I had to do it. On admission in the Emergency Room, the patient was almost catatonic, hardly able to talk. Today she is slightly better and is able to hold a brief conversation. During previous hospitalizations repeatedly the patient was offered ECT treatment, but she always declined as she is fearful of it. Unfortunately, her family supports this decision.  The patient denies psychotic symptoms now, but there is a history of psychotic depression. There are no symptoms suggestive of bipolar mania. She denies alcohol or illicit substance use.   PAST PSYCHIATRIC HISTORY: As above. She has been hospitalized at [facility] twice. She follows up with Dr. Georjean ModeLitz. She has community support team. There is history of suicide attempt by cutting and overdose. Her first hospitalization for depression was many years ago.   FAMILY PSYCHIATRIC HISTORY: None reported. No history of suicide.   PAST MEDICAL HISTORY: Failure to thrive.   ALLERGIES: CODEINE.   MEDICATIONS ON ADMISSION: Prozac 20 mg, trazodone 100 mg, Colace 100 mg twice daily.   SOCIAL HISTORY: She used to work at [employer] as a Conservation officer, naturecashier and was in a relationship. Somehow when the relationship terminated she became increasingly depressed and stopped working. She hardly leaves the house now. She has no income or insurance, but she did apply for disability and Medicaid. She just received a rejection letter.   REVIEW OF SYSTEMS: CONSTITUTIONAL: No fevers or chills. Positive for weight loss and fatigue.  EYES: No double or blurred vision.  ENT: No hearing loss. RESPIRATORY: No shortness of breath or cough.  CARDIOVASCULAR: No chest pain or orthopnea.  GASTROINTESTINAL: No abdominal pain, nausea, vomiting, or diarrhea.  GENITOURINARY: No incontinence or frequency.  ENDOCRINE: No heat or cold intolerance.  LYMPHATIC: No anemia or easy bruising.  INTEGUMENTARY: No acne or rash.  MUSCULOSKELETAL: No muscle or joint pain.  NEUROLOGIC: No tingling or weakness.  PSYCHIATRIC: See history of present illness for details.   PHYSICAL EXAMINATION: VITAL SIGNS: Blood pressure 105/70, pulse 57, respirations 18, temperature 98.4.  GENERAL: This is a slender, middle-aged female in no  acute distress.  HEENT: The pupils are equal, round and reactive to light. Sclerae anicteric.  NECK: Supple. No  thyromegaly.  LUNGS: Clear to auscultation. No dullness to percussion.  HEART: Regular rhythm and rate. No murmurs, rubs, or gallops.  ABDOMEN: Soft, nontender, nondistended. Positive bowel sounds.  MUSCULOSKELETAL: Normal muscle strength in all extremities.  SKIN: No rashes or bruises.  LYMPHATIC: No cervical adenopathy.  NEUROLOGIC: Cranial nerves II through XII are intact.   DIAGNOSTIC DATA: Chemistries are within normal limits except for blood sugar of 116 and potassium 3.2. Blood alcohol level is zero. LFTs: Total protein 5.9, albumin 3.3. Total bilirubin 0.1, alkaline phosphatase 43, ALT 13, ALT 15. Urine tox screen is negative for substances. CBC within normal limits. Urinalysis is not suggestive of urinary tract infection. Serum acetaminophen less than 2. Serum salicylates 3.4.   EKG: Normal sinus rhythm, normal EKG.  MENTAL STATUS EXAMINATION ON ADMISSION: The patient is alert and oriented to person, place, time and situation. She is pleasant, polite and cooperative. There is severe psychomotor retardation, poverty of speech and great latency. She does better on indirect questions and when questioned directly takes 2 or 3 minutes before she answers a 1 syllable response. She recognizes me from previous admission. She maintains good eye contact. Her speech is slow and soft with latency. Her mood is depressed with flat affect. Thought process is logical with its own logic. She denies suicidal ideation at the moment and has no plan in the hospital, but was admitted after a suicide attempt by medication overdose. She is not glad to be around. Her cognition is difficult to assess today. The patient has a hard time answering any questions, unable to register or recall. She used to be of average intelligence and fund of knowledge. Her insight and judgment is extremely poor.   SUICIDE RISK ASSESSMENT ON ADMISSION: This is a patient with a long history of depression who returns to the hospital for  worsening depression after a suicide attempt in the context of new social stressors in spite of good treatment adherence and community support team involvement.   INITIAL DIAGNOSES:  AXIS I: Major depressive disorder, recurrent, severe.  AXIS II: Deferred.  AXIS III: Failure to thrive.  AXIS IV: Social, economic problems, financial, housing, employment, access to care, primary support.  AXIS V: Global assessment of functioning 25.   PLAN: The patient was admitted to [facility] behavioral medicine unit for safety, stabilization and medication management. She was initially placed on suicide precautions and was closely monitored for any unsafe behavior. She underwent full psychiatric and risk assessment. She received pharmacotherapy, individual and group psychotherapy, substance abuse counseling, and support from therapeutic milieu.  1.  Suicidal ideation: The patient is able to contract for safety in the hospital.  2.  Mood: We will continue Prozac. The patient feels that it has been helpful. In the past she was maintained on Remeron and Abilify.  3.  Insomnia: We will continue trazodone.  4.  Constipation: Will continue Colace and MiraLax.  5.  Electroconvulsive therapy: I believe that it is time for the patient to get ECT treatment. She is reluctant, but is willing to look into it. Her family probably is an obstacle too. They are fairly simple people. They do not support ECT treatment in this patient. We will ask ECT staff to talk to the patient and will encourage it daily. 6.  Disposition: Will likely return to home.   ____________________________ Braulio Conte B. Jennet Maduro, MD  jbp:sb D: 04/19/2014 13:09:44 ET T: 04/19/2014 13:28:24 ET JOB#: [id number]  cc: Tosca Pletz B. Jennet Maduro, MD, <Dictator> Shari Prows MD ELECTRONICALLY SIGNED 04/21/2014 2:35

## 2014-12-29 NOTE — H&P (Signed)
PATIENT NAME:  Carol Choi, Carol Choi MR#:  [id number] DATE OF BIRTH:  03-09-1961  DATE OF ADMISSION:  11/04/2013  PLACE OF DICTATION:  [facility], [place], [place].  SEX:  Female.  RACE:  White.  AGE:  54 years.  INITIAL ASSESSMENT AND PSYCHIATRIC EVALUATION  IDENTIFYING INFORMATION:  The patient is a 54 year old white female, not employed and last worked in 2000 at [employer], and quit the job when her father died and had to take care of mother.  The patient is never married and currently lives with her mother, and both of them live in a mobile home.  The patient comes to inpatient hospitalization psychiatry at [facility] with a chief complaint "depressed and tried to cut myself, with suicide ideas."  HISTORY OF PRESENT ILLNESS:  The patient reports that she has been feeling depressed and overwhelmed with just stress of life and cannot pinpoint what is really the cause of the stress.   PAST PSYCHIATRIC HISTORY:  The patient reports had 1 inpatient hospitalization psychiatry at [facility] December of 1996 for a few days when she was depressed, and she came here instead of doing anything.  Not being followed by any psychiatrist, but she went to [facility] when she had suicidal thoughts and wishes, and then they recommended that she should come here for admission.  She went to [facility] prior to coming here to [facility].    FAMILY HISTORY OF MENTAL ILLNESS:  Not known for mental illness.  No known history of suicide in the family.  Apparently was raised by parents.  Father worked in Production designer, theatre/television/film.  Father died.  Mother stayed home.  Had 1 brother that died when he was murdered.  Has 1 sister living.  Close to family.    PERSONAL HISTORY:  Born in [place], [place]  No college.    WORK HISTORY:  Longest job was working at [employer], and she quit the job when her father died, to take care of her mother and family.    MILITARY HISTORY:  None.  MARRIAGES:  Never married.  Dated very little.   No children.   ALCOHOL AND DRUGS:  Denies drinking alcohol.  Denies street or prescription drug abuse.  Does admit smoking nicotine cigarettes, close to 2 packs a day for many years.   PAST MEDICAL HISTORY:  No known history of high blood pressure.  No diabetes mellitus.  No major surgeries.  No major injuries.  No history of motor vehicle accident.  Never been unconscious.  ALLERGIES:  No known drug allergies.   Not being followed by any physician.  Goes to Emergency Room as needed.    PHYSICAL EXAMINATION:   VITAL SIGNS:  Temperature 98.6, pulse is 66 per minute and regular, respirations 18 per minute and regular, blood pressure is 115/61 mmHg. HEENT:  Head is normocephalic, atraumatic.  Eyes, PERRLA.  Fundi benign.   NECK:  Supple. CHEST:  Normal expansion.  Normal breath sounds. HEART:  Normal S1, S2 without any murmurs heard. ABDOMEN:  Soft, no organomegaly.  Bowel sounds heard. RECTAL:  Deferred. PELVIC:  Deferred. NEUROLOGIC:  Gait is normal.  Romberg is negative.  Cranial nerves II through XII grossly intact.  DTRs 2+ normal.  Plantars are normal response.  MENTAL STATUS EXAMINATION:  The patient is dressed in hospital clothes.  Alert and oriented to place, person, and time.  She knew the name of the current President and feels low and down and depressed.  Feels hopeless and helpless  and worthless and useless for no specific reason.  Did have suicidal wishes of cutting herself, but currently she contracts for safety.  No psychosis.  Denies auditory or visual hallucinations.  Denies paranoid or suspicious ideas.  Cognition grossly intact.  General knowledge of information is fair for level of education.  Did have suicidal ideations at the time of admission, currently contracts for safety.  Insight and judgement guarded.  IMPRESSION:   AXIS 1:   1.  Major depressive disorder with suicidal ideas, but contracts for safety. 2.  Nicotine dependence. 3.  No major medical problems.  4.   long history of mental illness and decompensated for no specific stress AXIS V:  Global assessment of functioning 25.   PLAN:  The patient admitted to [facility] for close observation.  She will be started on Celexa to help with the depression.  She will be started on trazodone to help her rest at night so that she can sleep through the night.  She will be seen in the office.  She will be given mileau therapy and  supportive counseling.  She will take part in individual and group therapy with coping skills, and dealing with the daily stresses of life will be addressed.  At the time of discharge the patient will be stable.  Appropriate followup appointment will be made in the community.    ____________________________ Jannet MantisSurya K. Guss Bundehalla, MD skc:ea D: 11/04/2013 23:06:23 ET T: 11/05/2013 00:18:17 ET JOB#: [id number]  cc: Monika SalkSurya K. Guss Bundehalla, MD, <Dictator> Beau FannySURYA K Cage Gupton MD ELECTRONICALLY SIGNED 11/05/2013 18:07

## 2015-02-26 ENCOUNTER — Encounter: Payer: Self-pay | Admitting: Pharmacist

## 2015-02-26 ENCOUNTER — Encounter (INDEPENDENT_AMBULATORY_CARE_PROVIDER_SITE_OTHER): Payer: Self-pay

## 2015-03-17 IMAGING — CR DG CHEST 1V
1 series · 1 of 1 positions shown · non-contrast
Comparison: None.

CLINICAL DATA: Pre ECT clearance.

EXAM:
CHEST - 1 VIEW

[w chest pa]
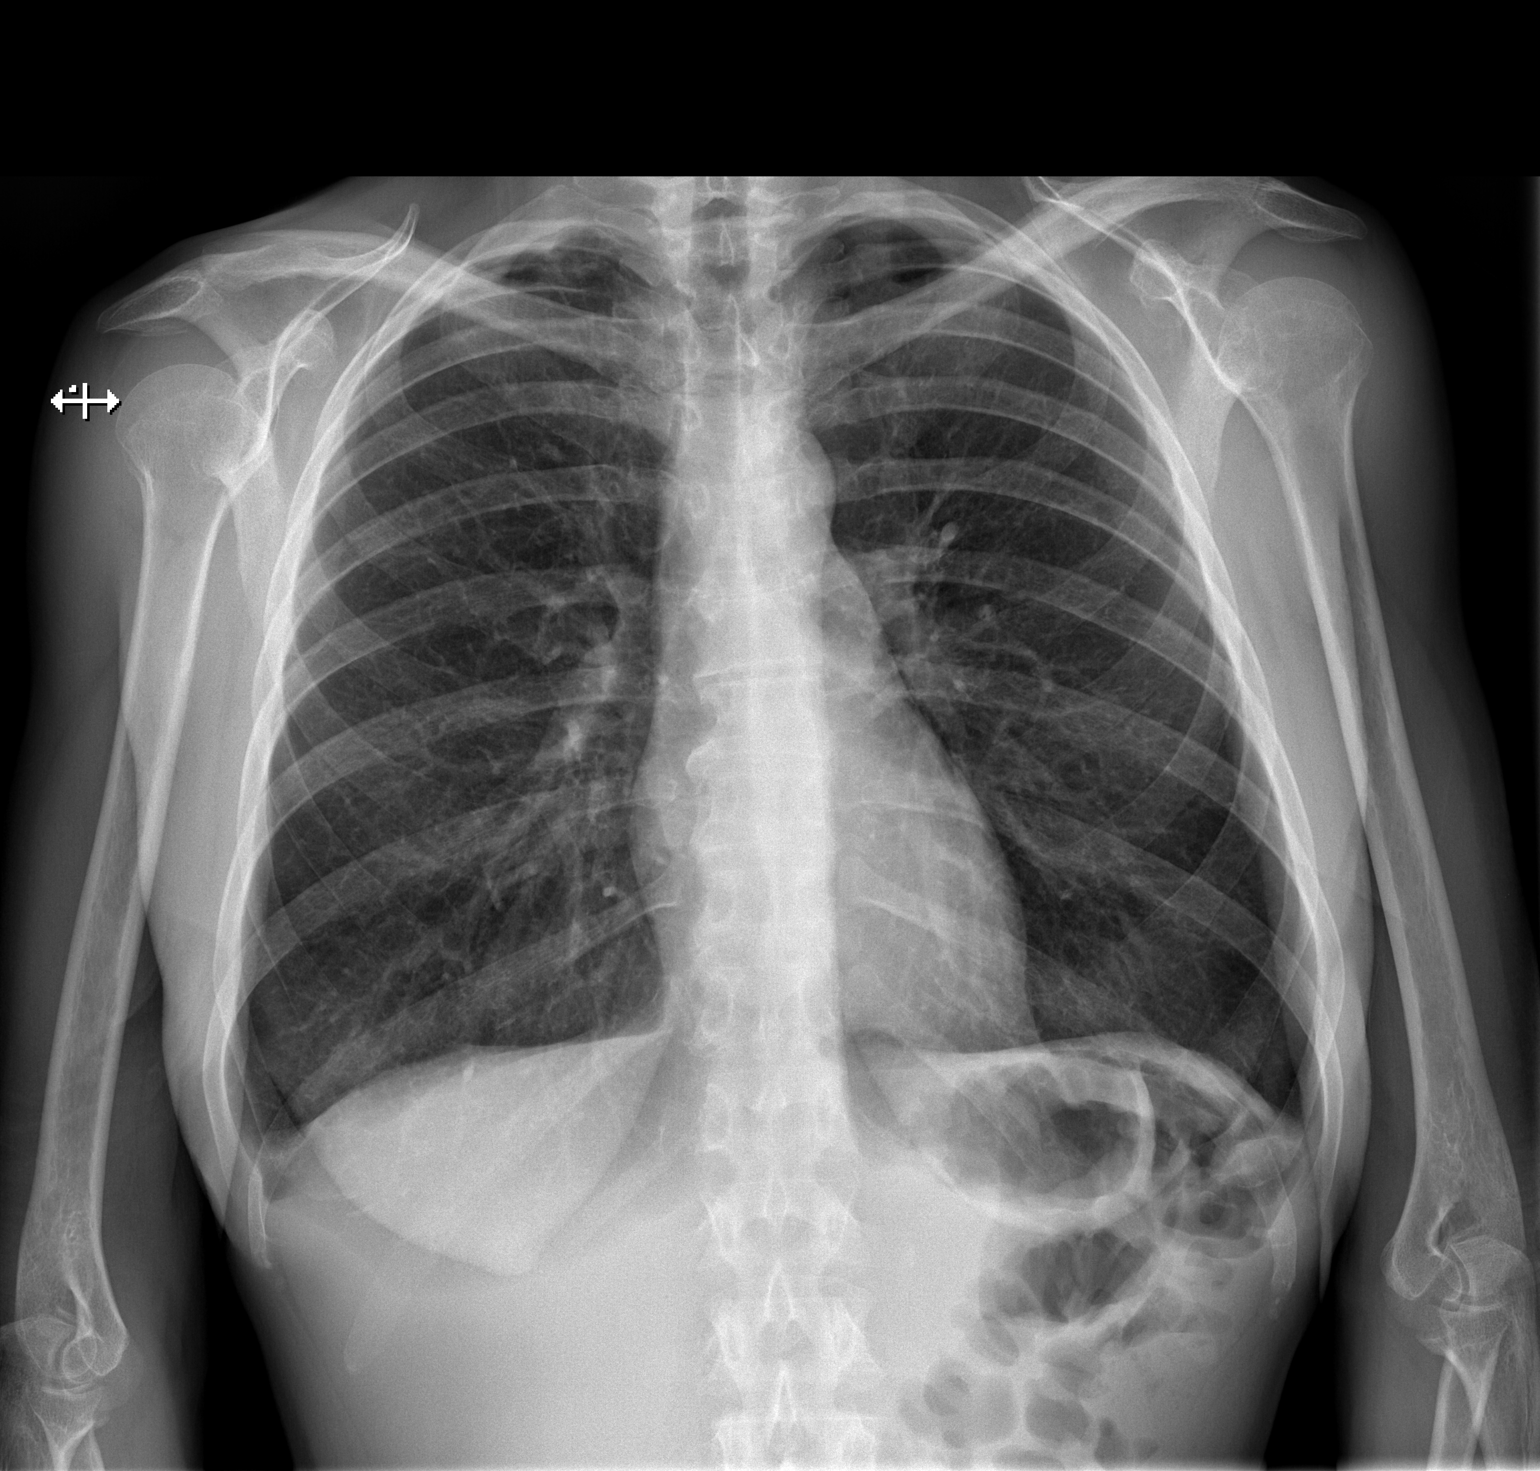

[1 of 1 positions shown; findings below may reference images not displayed]

FINDINGS: The heart size and mediastinal contours are within normal limits.
Both lungs are clear. The visualized skeletal structures are
unremarkable.
IMPRESSION: No active disease.

## 2021-04-03 ENCOUNTER — Institutional Professional Consult (permissible substitution): Payer: Self-pay | Admitting: Internal Medicine

## 2021-04-03 NOTE — Progress Notes (Deleted)
   Apolinar Junes, female    DOB: 30-May-1961,   MRN: [id number]   Brief patient profile:   60 yo ***  referred to pulmonary clinic 04/03/2021 for sob.   History of Present Illness  04/03/2021  Pulmonary/ 1st office eval/ Laurynn Mccorvey / [facility]  No chief complaint on file.    Dyspnea:  *** Cough: *** Sleep: *** SABA use:   No past medical history on file.  No outpatient medications prior to visit.   No facility-administered medications prior to visit.     Objective:     There were no vitals taken for this visit.         Assessment   No problem-specific Assessment & Plan notes found for this encounter.     Sandrea Hughs, MD 04/03/2021

## 2021-05-15 ENCOUNTER — Other Ambulatory Visit: Payer: Self-pay

## 2021-05-15 ENCOUNTER — Ambulatory Visit (INDEPENDENT_AMBULATORY_CARE_PROVIDER_SITE_OTHER): Payer: Medicaid Other | Admitting: Internal Medicine

## 2021-05-15 ENCOUNTER — Encounter: Payer: Self-pay | Admitting: Internal Medicine

## 2021-05-15 VITALS — BP 130/78 | HR 73 | Temp 98.4°F | Ht <= 58 in | Wt 188.8 lb

## 2021-05-15 DIAGNOSIS — R0602 Shortness of breath: Secondary | ICD-10-CM | POA: Diagnosis not present

## 2021-05-15 DIAGNOSIS — R9389 Abnormal findings on diagnostic imaging of other specified body structures: Secondary | ICD-10-CM | POA: Diagnosis not present

## 2021-05-15 DIAGNOSIS — Z87891 Personal history of nicotine dependence: Secondary | ICD-10-CM | POA: Diagnosis not present

## 2021-05-15 NOTE — Patient Instructions (Signed)
Continue ALBUTEROL AS NEEDED  OBTAIN PFT's TO ASSESS FOR COPD  CHECK  CHECK ONO  LUNG CANCER SCREENING PROGRAM REFERRAL

## 2021-05-15 NOTE — Progress Notes (Signed)
[facility] [place] [facility] Consultation      Date: 05/15/2021,   MRN# [id number] Carol Choi February 12, 1961       Admission                  Current  Carol Choi is a 60 y.o. old female seen in consultation for SOB at the request of [facility]     CHIEF COMPLAINT:   Shortness of breath   HISTORY OF PRESENT ILLNESS   60 year old white female seen today for assessment for shortness of breath Patient is a long-term smoking history approximately 2 packs a day for 40 years quit March 2020 Patient exposed to secondhand smoke exposure she lives in a trailer Progressive shortness of breath over the last several years Occasional cough Occasional wheezing  No exacerbation at this time No evidence of heart failure at this time No evidence or signs of infection at this time No respiratory distress No fevers, chills, nausea, vomiting, diarrhea No evidence of lower extremity edema No evidence hemoptysis  Patient uses alcohol occasionally no illicit drug use noted Patient is unemployed She has dogs at home Lives in a trailer with carpeting  Patient seems to be candidate for lung cancer screening program Patient uses albuterol as needed and this works well She uses approximately 1-2 times per week Her shortness of breath and dyspnea exertion seem to be resolved with albuterol use several times a week      PAST MEDICAL HISTORY   HYPOTHYROIDISM DEPRESSION  SURGICAL HISTORY   History reviewed. No pertinent surgical history.   FAMILY HISTORY  DM   SOCIAL HISTORY   Former smoker 2 packs a day for 40 years Unemployed Lives in a Psych[place], sport and exercise Dogs at home    MEDICATIONS    Home Medication:  Current Outpatient Rx   Order #: [id number] Class: Historical Med   Order #: [id number] Class: Historical Med   Order #: [id number] Class: Historical Med   Order #: [id number] Class: Historical Med   Order #: [id number] Class: Historical Med   Order #:  [id number] Class: Historical Med   Order #: [id number] Class: Historical Med    Current Medication:  Current Outpatient Medications:    ARIPiprazole (ABILIFY) 10 MG tablet, Take 10 mg by mouth daily., Disp: , Rfl:    atorvastatin (LIPITOR) 10 MG tablet, Take 10 mg by mouth daily., Disp: , Rfl:    buPROPion (WELLBUTRIN XL) 150 MG 24 hr tablet, Take 150 mg by mouth daily as needed., Disp: , Rfl:    levothyroxine (SYNTHROID) 50 MCG tablet, Take 50 mcg by mouth daily., Disp: , Rfl:    mirtazapine (REMERON) 45 MG tablet, Take 45 mg by mouth at bedtime., Disp: , Rfl:    Omeprazole 20 MG TBEC, Take 1 tablet by mouth daily., Disp: , Rfl:    PROAIR HFA 108 (90 Base) MCG/ACT inhaler, Inhale into the lungs., Disp: , Rfl:     ALLERGIES   Patient has no known allergies.     REVIEW OF SYSTEMS    Review of Systems:  Gen:  Denies  fever, sweats, chills weigh loss  HEENT: Denies blurred vision, double vision, ear pain, eye pain, hearing loss, nose bleeds, sore throat Cardiac:  No dizziness, chest pain or heaviness, chest tightness,edema Resp:   Denies cough or sputum porduction, shortness of breath,wheezing, hemoptysis,  Gi: Denies swallowing difficulty, stomach pain, nausea or vomiting, diarrhea, constipation, bowel incontinence Gu:  Denies bladder incontinence, burning urine Ext:   Denies Joint pain, stiffness or swelling Skin:  Denies  skin rash, easy bruising or bleeding or hives Endoc:  Denies polyuria, polydipsia , polyphagia or weight change Psych:   Denies depression, insomnia or hallucinations   Other:  All other systems negative  BP 130/78 (BP Location: Left Arm, Patient Position: Sitting, Cuff Size: Normal)   Pulse 73   Temp 98.4 F (36.9 C) (Oral)   Ht 4\' 10"  (1.473 m)   Wt 188 lb 12.8 oz (85.6 kg)   SpO2 97%   BMI 39.46 kg/m    Physical Examination:   General Appearance: No distress  EYES PERRLA, EOM intact.   NECK Supple, No JVD Pulmonary: normal breath sounds, No  wheezing.  CardiovascularNormal S1,S2.  No m/r/g.   Abdomen: Benign, Soft, non-tender. Skin:   warm, no rashes, no ecchymosis  Extremities: normal, no cyanosis, clubbing. Neuro:without focal findings,  speech normal  PSYCHIATRIC: Mood, affect within normal limits.   ALL OTHER ROS ARE NEGATIVE      IMAGING   CXR 2015 NO FILMS AVAILABLE TO VIEW IMPRESSION:  Hyperinflation with subtle, mildly nodular density in the left lung  base. If the patient has clinical signs of infection, this may  reflect a developing infiltrate and follow-up radiographs are  recommended after treatment to ensure resolution. Otherwise,  consider further evaluation with chest CT.     ASSESSMENT/PLAN    60 year old obese white female seen today for assessment for shortness of breath with a significant smoking history of 2 packs a day for 40 years along with extensive secondhand smoke exposure her symptoms most likely related to underlying COPD.  Patient seems to have control of symptoms with albuterol use only intermittently and probably does not need traditional inhaler therapy with corticosteroids or anticholinergics or long-acting beta agonist at this time.   Probable underlying COPD with shortness of breath dyspnea exertion and deconditioned state Patient will need pulmonary function testing to assess for COPD At this time continue albuterol as needed, will consider adding additional inhaler therapy if symptoms progress  Shortness of breath and dyspnea exertion 6-minute walk test to assess for exertional hypoxia Coronary pulse oximetry to assess for nocturnal hypoxia  Avoid secondhand smoke Avoid allergens  80-pack-year smoking history quit 2 years ago with the age of 50 she is a candidate for lung cancer screening referral program    MEDICATION ADJUSTMENTS/LABS AND TESTS ORDERED: Continue ALBUTEROL AS NEEDED  OBTAIN PFT's TO ASSESS FOR COPD  CHECK 77  CHECK ONO  LUNG CANCER SCREENING  PROGRAM REFERRAL    CURRENT MEDICATIONS REVIEWED AT LENGTH WITH PATIENT TODAY   Patient  satisfied with Plan of action and management. All questions answered  Follow up  3 months  Total Time Spent 47 mins   , M.D.  [facility]  Medical Director [facility] Medical Director [facility] Cardio-Pulmonary Department

## 2021-05-19 ENCOUNTER — Telehealth: Payer: Self-pay | Admitting: Internal Medicine

## 2021-05-19 NOTE — Telephone Encounter (Signed)
Lm for reminder of covid test prior to PFT.   05/21/2021 between [facility]

## 2021-05-20 NOTE — Telephone Encounter (Signed)
Lm x2 for patient.  Will close encounter per office protocol.   

## 2021-05-21 ENCOUNTER — Other Ambulatory Visit: Payer: Medicaid Other

## 2021-05-22 ENCOUNTER — Ambulatory Visit: Payer: Medicaid Other

## 2022-05-18 ENCOUNTER — Other Ambulatory Visit: Payer: Self-pay | Admitting: Physician Assistant

## 2022-05-18 DIAGNOSIS — G43009 Migraine without aura, not intractable, without status migrainosus: Secondary | ICD-10-CM

## 2022-05-18 DIAGNOSIS — R42 Dizziness and giddiness: Secondary | ICD-10-CM

## 2022-05-28 ENCOUNTER — Ambulatory Visit: Admission: RE | Admit: 2022-05-28 | Payer: Medicaid Other | Source: Ambulatory Visit

## 2022-06-08 ENCOUNTER — Ambulatory Visit
Admission: RE | Admit: 2022-06-08 | Discharge: 2022-06-08 | Disposition: A | Discharge status: Home / Self Care | Payer: Medicaid Other | Source: Ambulatory Visit | Attending: Physician Assistant | Admitting: Physician Assistant

## 2022-06-08 DIAGNOSIS — G43009 Migraine without aura, not intractable, without status migrainosus: Secondary | ICD-10-CM | POA: Diagnosis present

## 2022-06-08 DIAGNOSIS — R42 Dizziness and giddiness: Secondary | ICD-10-CM | POA: Diagnosis not present
